# Patient Record
Sex: Male | Born: 1990 | Race: White | Hispanic: No | Marital: Married | Smoking: Former smoker
Health system: Southern US, Community
[De-identification: ages and names within clinical notes are randomized; demographics above are authoritative.]

## PROBLEM LIST (undated history)

## (undated) DIAGNOSIS — I4892 Unspecified atrial flutter: Secondary | ICD-10-CM

## (undated) DIAGNOSIS — I4891 Unspecified atrial fibrillation: Secondary | ICD-10-CM

## (undated) DIAGNOSIS — R002 Palpitations: Secondary | ICD-10-CM

## (undated) HISTORY — DX: Unspecified atrial fibrillation: I48.91

## (undated) HISTORY — DX: Palpitations: R00.2

## (undated) HISTORY — PX: NO PAST SURGERIES: SHX2092

## (undated) HISTORY — DX: Unspecified atrial flutter: I48.92

---

## 2021-07-16 ENCOUNTER — Other Ambulatory Visit: Payer: Self-pay

## 2021-07-16 ENCOUNTER — Ambulatory Visit (INDEPENDENT_AMBULATORY_CARE_PROVIDER_SITE_OTHER): Payer: 59

## 2021-07-16 DIAGNOSIS — E7800 Pure hypercholesterolemia, unspecified: Secondary | ICD-10-CM | POA: Diagnosis not present

## 2021-07-16 DIAGNOSIS — I4891 Unspecified atrial fibrillation: Secondary | ICD-10-CM | POA: Diagnosis not present

## 2021-07-16 DIAGNOSIS — I4892 Unspecified atrial flutter: Secondary | ICD-10-CM | POA: Diagnosis not present

## 2021-07-16 DIAGNOSIS — R002 Palpitations: Secondary | ICD-10-CM

## 2021-07-16 DIAGNOSIS — R079 Chest pain, unspecified: Secondary | ICD-10-CM | POA: Diagnosis not present

## 2021-07-16 DIAGNOSIS — M79605 Pain in left leg: Secondary | ICD-10-CM | POA: Diagnosis not present

## 2021-07-30 ENCOUNTER — Ambulatory Visit: Payer: 59 | Admitting: Cardiology

## 2021-07-30 VITALS — BP 132/70 | HR 64 | Ht 78.0 in | Wt 261.8 lb

## 2021-07-30 DIAGNOSIS — R002 Palpitations: Secondary | ICD-10-CM | POA: Insufficient documentation

## 2021-07-30 DIAGNOSIS — I4892 Unspecified atrial flutter: Secondary | ICD-10-CM | POA: Insufficient documentation

## 2021-07-30 DIAGNOSIS — I480 Paroxysmal atrial fibrillation: Secondary | ICD-10-CM | POA: Insufficient documentation

## 2021-07-30 DIAGNOSIS — E782 Mixed hyperlipidemia: Secondary | ICD-10-CM | POA: Insufficient documentation

## 2021-07-30 DIAGNOSIS — I4891 Unspecified atrial fibrillation: Secondary | ICD-10-CM | POA: Insufficient documentation

## 2021-07-30 DIAGNOSIS — Z8249 Family history of ischemic heart disease and other diseases of the circulatory system: Secondary | ICD-10-CM | POA: Diagnosis not present

## 2021-07-30 MED ORDER — ROSUVASTATIN CALCIUM 10 MG PO TABS: 10.0000 mg | ORAL_TABLET | Freq: Every day | ORAL | 3 refills | Status: AC

## 2021-07-30 NOTE — Progress Notes (Signed)
Cardiology Office Note:    Date:  07/30/2021   ID:  Jerry Mcintosh, DOB 1990-10-27, MRN [id number]  PCP:  Pcp, No  Cardiologist:  Garwin Brothers, MD   Referring MD: No ref. provider found    ASSESSMENT:    1. Palpitations   2. Mixed dyslipidemia   3. Family history of coronary artery disease    PLAN:    In order of problems listed above:  Primary prevention stressed to the patient.  Importance of compliance with diet medication stressed and vocalized understanding.  He was advised to at least walk half an hour a day 5 days a week and he promises to do so.  Cardiac Paroxysmal atrial fibrillation: Only 1 noninducible.  His symptoms are very sparse.  He consumed heavy amounts of caffeine and energy drinks which probably led to this.  He is completely cut them out since then and has had no issues.  We will monitor him.  ZIO monitoring was done and we will check that also.  It will be back in the next few days. Mixed dyslipidemia: Markedly elevated lipids.  Family history of coronary artery disease.  We will do calcium scoring for restratification.  I advised diet and she initiated her on rosuvastatin 10 mg daily.  Liver lipid check in 6 weeks. Cigarette smoker: Used to smoke but currently vapes.  Minimal and plans to quit.  I told him the importance of quitting all of this altogether and he promises to do so. Patient will be seen in follow-up appointment in 6 months or earlier if the patient has any concerns    Medication Adjustments/Labs and Tests Ordered: Current medicines are reviewed at length with the patient today.  Concerns regarding medicines are outlined above.  Orders Placed This Encounter  Procedures   CT CARDIAC SCORING   Basic Metabolic Panel (BMET)   Hepatic function panel   Lipid Profile   ECHOCARDIOGRAM COMPLETE   Meds ordered this encounter  Medications   rosuvastatin (CRESTOR) 10 MG tablet    Sig: Take 1 tablet (10 mg total) by mouth daily.    Dispense:   90 tablet    Refill:  3     History of Present Illness:    Jerry Mcintosh is a 31 y.o. male who is being seen today for the evaluation of paroxysmal atrial fibrillation at the request of the emergency room.  Patient is a pleasant 31 year old male.  He has past medical history that is not much significant.  He had significant palpitations for which she went to the emergency room.  He was found to be in atrial fibrillation and he tells me that he converted in sinus when he was there.  Subsequently was discharged.  At the time of my evaluation is alert awake oriented and in no distress.  This is pretty much the only time he has had any issues.  He tells me that he was consuming energy drinks heavily and had this episode.  Overall, he also has significant amounts of caffeine and he has cut out all of these issues significantly and completely.  At the time of my evaluation, the patient is alert awake oriented and in no distress.  Past Medical History:  Diagnosis Date   Atrial fibrillation (HCC)    Atrial flutter (HCC)    Palpitations     Past Surgical History:  Procedure Laterality Date   NO PAST SURGERIES      Current Medications: Current Meds  Medication Sig  aspirin EC 81 MG tablet Take 81 mg by mouth daily.   rosuvastatin (CRESTOR) 10 MG tablet Take 1 tablet (10 mg total) by mouth daily.     Allergies:   Penicillins   Social History   Socioeconomic History   Marital status: Married    Spouse name: Not on file   Number of children: Not on file   Years of education: Not on file   Highest education level: Not on file  Occupational History   Not on file  Tobacco Use   Smoking status: Former    Types: Cigarettes    Quit date: 07/16/2021    Years since quitting: 0.0   Smokeless tobacco: Never  Substance and Sexual Activity   Alcohol use: Not on file   Drug use: Not on file   Sexual activity: Not on file  Other Topics Concern   Not on file  Social History Narrative    Not on file   Social Determinants of Health   Financial Resource Strain: Not on file  Food Insecurity: Not on file  Transportation Needs: Not on file  Physical Activity: Not on file  Stress: Not on file  Social Connections: Not on file     Family History: The patient's family history includes CAD in his father; Cancer in his father; Heart attack in his father.  ROS:   Please see the history of present illness.    All other systems reviewed and are negative.  EKGs/Labs/Other Studies Reviewed:    The following studies were reviewed today: EKG reveals atrial fibrillation with controlled ventricular rate this was in the emergency room.   Recent Labs: No results found for requested labs within last 8760 hours.  Recent Lipid Panel No results found for: CHOL, TRIG, HDL, CHOLHDL, VLDL, LDLCALC, LDLDIRECT  Physical Exam:    VS:  BP 132/70   Pulse 64   Ht 6\' 6"  (1.981 m)   Wt 261 lb 12.8 oz (118.8 kg)   SpO2 99%   BMI 30.25 kg/m     Wt Readings from Last 3 Encounters:  07/30/21 261 lb 12.8 oz (118.8 kg)     GEN: Patient is in no acute distress HEENT: Normal NECK: No JVD; No carotid bruits LYMPHATICS: No lymphadenopathy CARDIAC: S1 S2 regular, 2/6 systolic murmur at the apex. RESPIRATORY:  Clear to auscultation without rales, wheezing or rhonchi  ABDOMEN: Soft, non-tender, non-distended MUSCULOSKELETAL:  No edema; No deformity  SKIN: Warm and dry NEUROLOGIC:  Alert and oriented x 3 PSYCHIATRIC:  Normal affect    Signed, 08/01/21, MD  07/30/2021 8:48 AM    [facility]

## 2021-07-30 NOTE — Patient Instructions (Signed)
Medication Instructions:  Your physician has recommended you make the following change in your medication:   START: Rosouvastatin 10 mg daily  *If you need a refill on your cardiac medications before your next appointment, please call your pharmacy*   Lab Work: Your physician recommends that you return for lab work in:   Labs in 6 weeks: BMP, LFT's, Lipids  If you have labs (blood work) drawn today and your tests are completely normal, you will receive your results only by: MyChart Message (if you have MyChart) OR A paper copy in the mail If you have any lab test that is abnormal or we need to change your treatment, we will call you to review the results.   Testing/Procedures: Your physician has requested that you have an echocardiogram. Echocardiography is a painless test that uses sound waves to create images of your heart. It provides your doctor with information about the size and shape of your heart and how well your heart's chambers and valves are working. This procedure takes approximately one hour. There are no restrictions for this procedure.   We will order CT coronary calcium score. It will cost $99.00 and is not covered by insurance.  Please call to schedule.    [facility]  [address]  [place], [place] [address] [phone]            Or [facility] [address] [place], [place] [address] [phone]    Follow-Up: At [facility], you and your health needs are our priority.  As part of our continuing mission to provide you with exceptional heart care, we have created designated Provider Care Teams.  These Care Teams include your primary Cardiologist (physician) and Advanced Practice Providers (APPs -  Physician Assistants and Nurse Practitioners) who all work together to provide you with the care you need, when you need it.  We recommend signing up for the patient portal called "MyChart".  Sign up information is provided on  this After Visit Summary.  MyChart is used to connect with patients for Virtual Visits (Telemedicine).  Patients are able to view lab/test results, encounter notes, upcoming appointments, etc.  Non-urgent messages can be sent to your provider as well.   To learn more about what you can do with MyChart, go to [email or web address].    Your next appointment:   3 month(s)  The format for your next appointment:   In Person  Provider:   Belva Crome, MD    Other Instructions None  Important Information About Sugar

## 2021-08-03 ENCOUNTER — Ambulatory Visit: Payer: 59 | Admitting: Cardiology

## 2021-08-03 DIAGNOSIS — R002 Palpitations: Secondary | ICD-10-CM | POA: Diagnosis not present

## 2021-08-08 ENCOUNTER — Ambulatory Visit (INDEPENDENT_AMBULATORY_CARE_PROVIDER_SITE_OTHER): Payer: 59

## 2021-08-08 ENCOUNTER — Ambulatory Visit (HOSPITAL_BASED_OUTPATIENT_CLINIC_OR_DEPARTMENT_OTHER)
Admission: RE | Admit: 2021-08-08 | Discharge: 2021-08-08 | Disposition: A | Discharge status: Home / Self Care | Payer: 59 | Source: Ambulatory Visit | Attending: Cardiology | Admitting: Cardiology

## 2021-08-08 DIAGNOSIS — R002 Palpitations: Secondary | ICD-10-CM | POA: Insufficient documentation

## 2021-08-08 LAB — ECHOCARDIOGRAM COMPLETE
Area-P 1/2: 3.89 cm2
S' Lateral: 3.3 cm

## 2021-08-08 IMAGING — CT CT CARDIAC CORONARY ARTERY CALCIUM SCORE
2 of 3 series · 4 of 20 positions shown, 5 images · non-contrast
Comparison: None Available.
COMPARISON: None Available.

Addendum:
EXAM:
OVER-READ INTERPRETATION  CT CHEST

The following report is a limited chest CT over-read performed by
[DATE]. This over-read does not include interpretation of cardiac
or coronary anatomy or pathology. The coronary artery calcium score
interpretation by the cardiologist is attached.
CLINICAL DATA: Risk stratification
Coronary Calcium Score
TECHNIQUE: The patient was scanned on a Siemens Force scanner. Axial
non-contrast 3 mm slices were carried out through the heart. The
data set was analyzed on a dedicated work station and scored using
the Agatson method.

[Series 2: cascseq 3.0 b35f 70% · axial · 0.42mm/px · z∈[-304,-250]mm · 2 of 56 slices shown]
[im 19/56  vessel]
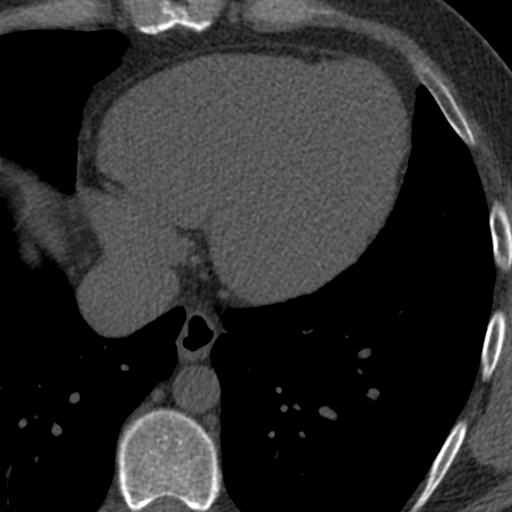
[im 37/56  vessel]
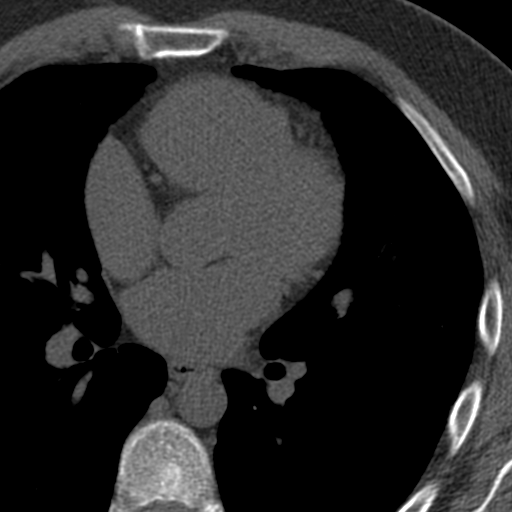

[Series 3: full fov st · axial · 0.62mm/px · z∈[-304,-250]mm · 2 of 56 slices shown, 3 images]
[im 19/56  vessel]
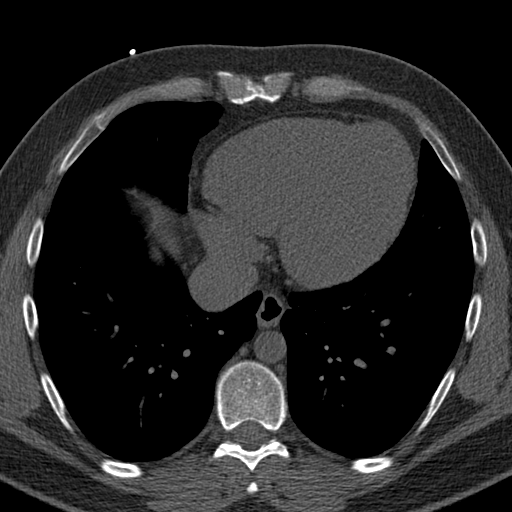
[im 19/56  lung]
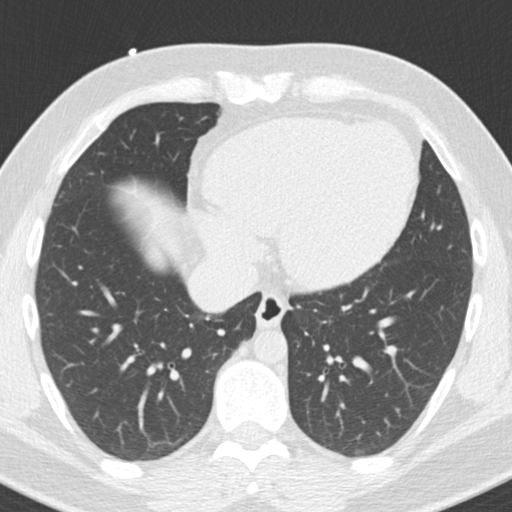
[im 37/56  vessel]
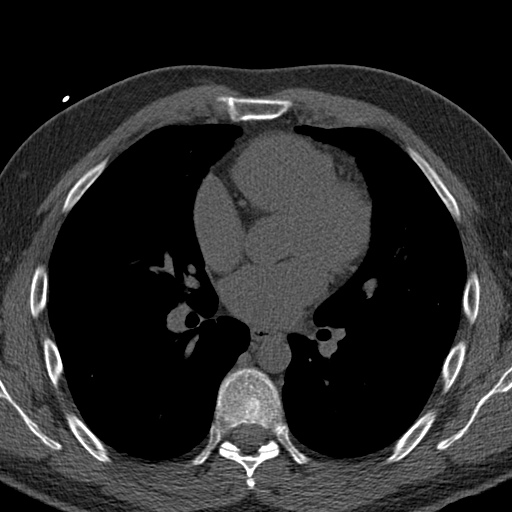

[4 of 20 positions shown; findings below may reference images not displayed]

FINDINGS: Vascular: No acute noncardiac vascular finding.

Mediastinum/Nodes: No pathologically enlarged mediastinal or hilar
lymph nodes identified within the visualized portions of the thorax.
Patulous esophagus with small hiatal hernia.

Lungs/Pleura: Within the visualized portions of the thorax there are
no suspicious pulmonary nodules or masses, no pleural effusion or
pneumothorax and no focal airspace consolidation.

Upper Abdomen: No acute abnormality.

Musculoskeletal: No acute osseous abnormality
IMPRESSION: No acute noncardiac finding in the thorax.
FINDINGS: Non-cardiac: See separate report from [REDACTED].

Ascending Aorta: Normal

Pericardium: Normal

Coronary arteries: Normal origin
IMPRESSION: Coronary calcium score of 0.

*** End of Addendum ***
EXAM:
OVER-READ INTERPRETATION  CT CHEST

The following report is a limited chest CT over-read performed by
[DATE]. This over-read does not include interpretation of cardiac
or coronary anatomy or pathology. The coronary artery calcium score
interpretation by the cardiologist is attached.
FINDINGS: Vascular: No acute noncardiac vascular finding.

Mediastinum/Nodes: No pathologically enlarged mediastinal or hilar
lymph nodes identified within the visualized portions of the thorax.
Patulous esophagus with small hiatal hernia.

Lungs/Pleura: Within the visualized portions of the thorax there are
no suspicious pulmonary nodules or masses, no pleural effusion or
pneumothorax and no focal airspace consolidation.

Upper Abdomen: No acute abnormality.

Musculoskeletal: No acute osseous abnormality
IMPRESSION: No acute noncardiac finding in the thorax.
